# Patient Record
Sex: Male | Born: 1985 | Race: White | Hispanic: No | Marital: Single | Smoking: Former smoker
Health system: Southern US, Community
[De-identification: ages and names within clinical notes are randomized; demographics above are authoritative.]

## PROBLEM LIST (undated history)

## (undated) HISTORY — PX: WISDOM TOOTH EXTRACTION: SHX21

---

## 2013-03-21 ENCOUNTER — Ambulatory Visit: Payer: Self-pay | Admitting: Family Medicine

## 2015-10-27 ENCOUNTER — Encounter: Payer: Self-pay | Admitting: *Deleted

## 2015-10-27 ENCOUNTER — Ambulatory Visit
Admission: EM | Admit: 2015-10-27 | Discharge: 2015-10-27 | Disposition: A | Discharge status: Home / Self Care | Payer: BLUE CROSS/BLUE SHIELD | Attending: Family Medicine | Admitting: Family Medicine

## 2015-10-27 DIAGNOSIS — Z8679 Personal history of other diseases of the circulatory system: Secondary | ICD-10-CM | POA: Diagnosis not present

## 2015-10-27 DIAGNOSIS — Z87891 Personal history of nicotine dependence: Secondary | ICD-10-CM | POA: Diagnosis not present

## 2015-10-27 DIAGNOSIS — R9431 Abnormal electrocardiogram [ECG] [EKG]: Secondary | ICD-10-CM | POA: Diagnosis not present

## 2015-10-27 DIAGNOSIS — R000 Tachycardia, unspecified: Secondary | ICD-10-CM | POA: Diagnosis present

## 2015-10-27 LAB — BASIC METABOLIC PANEL
Anion gap: 11 (ref 5–15)
BUN: 13 mg/dL (ref 6–20)
CHLORIDE: 99 mmol/L — AB (ref 101–111)
CO2: 29 mmol/L (ref 22–32)
CREATININE: 0.73 mg/dL (ref 0.61–1.24)
Calcium: 10.2 mg/dL (ref 8.9–10.3)
GFR calc non Af Amer: >60 mL/min (ref >60)
Glucose, Bld: 94 mg/dL (ref 65–99)
Potassium: 3.9 mmol/L (ref 3.5–5.1)
SODIUM: 139 mmol/L (ref 135–145)

## 2015-10-27 LAB — CBC WITH DIFFERENTIAL/PLATELET
BASOS PCT: 1 %
Basophils Absolute: 0.1 10*3/uL (ref 0–0.1)
EOS ABS: 0 10*3/uL (ref 0–0.7)
Eosinophils Relative: 0 %
HCT: 44.8 % (ref 40.0–52.0)
HEMOGLOBIN: 14.7 g/dL (ref 13.0–18.0)
Lymphocytes Relative: 7 %
Lymphs Abs: 1 10*3/uL (ref 1.0–3.6)
MCH: 29 pg (ref 26.0–34.0)
MCHC: 32.8 g/dL (ref 32.0–36.0)
MCV: 88.4 fL (ref 80.0–100.0)
MONOS PCT: 4 %
Monocytes Absolute: 0.6 10*3/uL (ref 0.2–1.0)
NEUTROS PCT: 88 %
Neutro Abs: 11.9 10*3/uL — ABNORMAL HIGH (ref 1.4–6.5)
Platelets: 295 10*3/uL (ref 150–440)
RBC: 5.06 MIL/uL (ref 4.40–5.90)
RDW: 12.9 % (ref 11.5–14.5)
WBC: 13.6 10*3/uL — AB (ref 3.8–10.6)

## 2015-10-27 NOTE — ED Notes (Signed)
Patient reports symptom of fast heart rate starting today at 11:00. Patient also reports that he has had cold symptoms in the last few weeks that have resolved and most recently symptoms of stomach cramps with loose - soft stools. Patient appears to be anxious.

## 2015-11-20 ENCOUNTER — Other Ambulatory Visit: Payer: Self-pay | Admitting: Family Medicine

## 2015-11-20 DIAGNOSIS — R1011 Right upper quadrant pain: Secondary | ICD-10-CM

## 2015-12-03 ENCOUNTER — Ambulatory Visit
Admission: RE | Admit: 2015-12-03 | Discharge: 2015-12-03 | Disposition: A | Discharge status: Home / Self Care | Payer: BLUE CROSS/BLUE SHIELD | Source: Ambulatory Visit | Attending: Family Medicine | Admitting: Family Medicine

## 2015-12-03 DIAGNOSIS — R1011 Right upper quadrant pain: Secondary | ICD-10-CM | POA: Diagnosis not present

## 2015-12-03 MED ORDER — TECHNETIUM TC 99M MEBROFENIN IV KIT
5.5800 | PACK | Freq: Once | INTRAVENOUS | Status: AC | PRN
  Administered 2015-12-03: 5.58 via INTRAVENOUS

## 2015-12-03 MED ORDER — SINCALIDE 5 MCG IJ SOLR
0.0200 ug/kg | Freq: Once | INTRAMUSCULAR | Status: AC
  Administered 2015-12-03: 1.23 ug via INTRAVENOUS

## 2015-12-19 NOTE — ED Provider Notes (Signed)
CSN: [id number]     Arrival date & time 10/27/15  1435 History   First MD Initiated Contact with Patient 10/27/15 1636     Chief Complaint  Patient presents with  . Tachycardia   (Consider location/radiation/quality/duration/timing/severity/associated sxs/prior Treatment) HPI Comments: 30 yo male with a c/o "fast heart rate" today around 11am, but now resolved. States has had cold symptoms recently and some mild loose stools and stomach cramps. Denies any chest pain, shortness of breath, melena, hematochezia, vomiting, fevers, chills.   The history is provided by the patient.    History reviewed. No pertinent past medical history. Past Surgical History  Procedure Laterality Date  . Wisdom tooth extraction     History reviewed. No pertinent family history. Social History  Substance Use Topics  . Smoking status: Former Games developermoker  . Smokeless tobacco: Never Used  . Alcohol Use: Yes     Comment: occassional drinker    Review of Systems  Allergies  Review of patient's allergies indicates no known allergies.  Home Medications   Prior to Admission medications   Not on File   Meds Ordered and Administered this Visit  Medications - No data to display  BP 132/58 mmHg  Pulse 96  Temp(Src) 98.2 F (36.8 C) (Oral)  Resp 20  Ht 6\' 1"  (1.854 m)  Wt 135 lb (61.236 kg)  BMI 17.82 kg/m2  SpO2 100% No data found.   Physical Exam  Constitutional: He appears well-developed and well-nourished. No distress.  HENT:  Head: Normocephalic and atraumatic.  Right Ear: Tympanic membrane, external ear and ear canal normal.  Left Ear: Tympanic membrane, external ear and ear canal normal.  Nose: Nose normal.  Mouth/Throat: Uvula is midline, oropharynx is clear and moist and mucous membranes are normal. No oropharyngeal exudate or tonsillar abscesses.  Eyes: Conjunctivae and EOM are normal. Pupils are equal, round, and reactive to light. Right eye exhibits no discharge. Left eye exhibits no  discharge. No scleral icterus.  Neck: Normal range of motion. Neck supple. No tracheal deviation present. No thyromegaly present.  Cardiovascular: Normal rate, regular rhythm and normal heart sounds.   Pulmonary/Chest: Effort normal and breath sounds normal. No stridor. No respiratory distress. He has no wheezes. He has no rales. He exhibits no tenderness.  Abdominal: Soft. Bowel sounds are normal. He exhibits no distension and no mass. There is no tenderness. There is no rebound and no guarding.  Lymphadenopathy:    He has no cervical adenopathy.  Neurological: He is alert.  Skin: Skin is warm and dry. No rash noted. He is not diaphoretic.  Nursing note and vitals reviewed.   ED Course  Procedures (including critical care time)  Labs Review Labs Reviewed  CBC WITH DIFFERENTIAL/PLATELET - Abnormal; Notable for the following:    WBC 13.6 (*)    Neutro Abs 11.9 (*)    All other components within normal limits  BASIC METABOLIC PANEL - Abnormal; Notable for the following:    Chloride 99 (*)    All other components within normal limits    Imaging Review No results found.   Visual Acuity Review  Right Eye Distance:   Left Eye Distance:   Bilateral Distance:    Right Eye Near:   Left Eye Near:    Bilateral Near:      EKG: there are no previous tracings available for comparison, normal sinus rhythm, incomplete right bundle branch block; reviewed by me and agree.  MDM   1. H/O sinus tachycardia  2. Abnormal EKG   (curently asymptomatic)   There are no discharge medications for this patient.  1. Lab results and diagnosis reviewed with patient; recommend follow up with PCP for referral to cardiologist and possible further work-up 2. Follow-up prn if symptoms worsen or don't improve    Payton Mccallum, MD 12/19/15 2051

## 2016-01-14 ENCOUNTER — Encounter: Payer: Self-pay | Admitting: Dietician

## 2016-01-14 ENCOUNTER — Encounter: Payer: BLUE CROSS/BLUE SHIELD | Attending: Pediatrics | Admitting: Dietician

## 2016-01-14 VITALS — Ht 74.0 in | Wt 127.3 lb

## 2016-01-14 DIAGNOSIS — R634 Abnormal weight loss: Secondary | ICD-10-CM

## 2016-01-14 NOTE — Progress Notes (Signed)
Medical Nutrition Therapy: Visit start time: 8:30  end time: 9:45am Assessment:  Diagnosis: abnormal weight loss Past medical history: no previous health issues reported Psychosocial issues/ stress concerns: Patient rates his stress as moderate and indicates "ok" as to how well he is dealing with his stress.  Preferred learning method:  . Visual  Current weight: 127.3 lbs  Height: 74 in Medications, supplements: probiotic; no other medications Progress and evaluation:  Patient in for initial medical nutrition therapy appointment. Reports that his typical adult weight ranges from 130-135 lbs. At the end of last year he began to have a change in bowl habits from 2-3 bowel movements per day to averaging 1 per day. He also noticed a change in the consistency of the stool from more solid to at times to varying -"floating" or "mushy"  or loose or sometimes what he describes as a smear. He also reports some abdominal cramping or discomfort and bloating. Celiac and H-pylori have been ruled out. He was initially told to eat a low fat diet in case his gall bladder was involved.He reports this was a significant change in his diet which previously included frequent high fat foods such as pizza and cheeseburgers. He lost weight with lowest reported weight of 122 lbs. A FODMAP diet was recommended after gall bladder disease was ruled out. He has followed this diet and reports he feels better but the inconsistency of bowel movements both in frequency and texture has not changed. He also continues to experience bloating and discomfort especially in the evenings. He has gained approximately 2 lbs in past month. He brought food records which indicate adherence to FODMAP with some exceptions of regular bread and regular milk. His diet is adequate in protein, low in calcium sources, fruits and vegetables (many of these limited due to diet restrictions) and also low in grains. His diet is also low in healthy mono and  polyunsaturated fats. He has increased his water intake but overall fluid intake is possibly low. Physical activity: running 3x/week for 1 hour.  Dietary Intake:  Usual eating pattern includes 3 meals and 0-1 snacks per day. Dining out frequency: 0 meals per week.  Breakfast: 1/2 cup coffee (4oz), banana; sometimes also eats cheerios (dry) Lunch:Ex. 1 packet salmon or tuna, 1 slice of GF bread, 1 GF cookie or cheerios/rice crackers or leftovers such as rice/chicken, Supper: Ex.GF pasta, sauce, salad or GF tacos with beef, salad Snack: Ex.GF cookies, soy ice cream  Beverages: 4-5+ cups of water per day, 4-6 oz. coffee  Nutrition Care Education: Basic nutrition: Discussed ways to better balance meals to increase allowed grains and fruits/vegetables. Encouraged more calcium sources possibly through more soy milk or lactaid milk, hard cheeses, and soy yogurts. Discussed pro and anti-inflammatory foods. Ways to increase calories: Gave and reviewed list of healthy ways to add calories to the diet. Discussed adding more sources of healthy fats. FODMAP: Patient has a good understanding of high FODMAP foods and overall is eliminating most of these.   Nutritional Diagnosis:  [facility]-3.1 Underweight As related to history of being underweight, recent decrease in calories due to GI issues.  As evidenced by diet history and BMI of 16.4..  Intervention:  Continue with eliminating high FODMAP foods. Refer to list of ways to add protein and calories and incorporate into diet. Add a protein at breakfast such as peanut butter. Balance meals with protein and starch along with allowed vegetables and fruits that are on low Fodmap diet. Add oil or trans  free margarine (Promise, I can't believe it's not butter"  to any appropriate food ( vegetables, oatmeal, GF bread, rice)  Education Materials given:  . Handouts related to low and high FODMAP foods . Ways to increase calories and protein. . Goals/  instructions Learner/ who was taught:  . Patient  Level of understanding: Marland Kitchen. Verbalizes/ demonstrates competency  Learning barriers: . None  Willingness to learn/ readiness for change: . Eager, change in progress Monitoring and Evaluation:  Dietary intake, exercise,, and body weight      follow up: 02/12/16 at 1:00pm

## 2016-01-14 NOTE — Patient Instructions (Signed)
Continue with eliminating high FODMAP foods. Refer to list of ways to add protein and calories. Add a protein at breakfast such as peanut butter. Balance meals with protein and starch along with allowed vegetables and fruits that are on low Fodmap diet. Add oil or trans free margarine (Promise, I can't believe it's  to any appropriate food ( vegetables, oatmeal, GF bread, rice)

## 2016-02-12 ENCOUNTER — Encounter: Payer: BLUE CROSS/BLUE SHIELD | Attending: Pediatrics | Admitting: Dietician

## 2016-02-12 DIAGNOSIS — R634 Abnormal weight loss: Secondary | ICD-10-CM | POA: Diagnosis not present

## 2016-02-12 NOTE — Progress Notes (Signed)
Medical Nutrition Therapy Follow-up visit:  Time with patient: 1:15- 2:15 Visit #: 2 ASSESSMENT:  Diagnosis:abnormal weight loss  Current weight:128.3 lbs    Height: 74 in Medications: See list Progress and evaluation: Patient in for medical nutrition therapy follow-up. He has gained 1 lb in past month and now weighs 5 lbs greater than lowest reported weight of 122 lbs. He kept food records which indicate he is following the FODMAP diet which was reviewed with him at initial visit. He has increased his intake of fruits/vegetables allowed on FODMAP diet. He has also made an effort to include more fat in the form of nuts, avocados, margarine and olive oil. He reports he has more energy but there have been no changes in frequency and very little difference in consistency of bowel movements. He reports that he sometimes has to strain to have a bowel movement but usually has 1 per day. He continues to have bloating after every meal and stays bloated for most of the day. His diet continues to be low in calcium sources and is also low in allowed grains on most days.  NUTRITION CARE EDUCATION: Basic Nutrition: Reviewed food records with patient.Discussed recommendations for calcium and ways to meet through diet avoiding dairy products as well as need to add more grains and options. Ways to increase calories: Commended on the foods added to his diet to increase calories and encouraged him to continue to do so especially in the form of soy milk products fortified with calcium and healthy mono and polyunsaturated fats.  INTERVENTION:  Continue to add healthy fats to any food possible. Continue with FODMAP diet. Include at least 3 non-dairy milk products per day that are fortified with calcium. Make appointment with primary care physician to discuss if GI consult is needed. Try Quinoa.  EDUCATION MATERIALS GIVEN:  . Goals/ instructions  LEARNER/ who was taught:  . Patient   LEVEL OF  UNDERSTANDING: . Verbalizes/ demonstrates competency LEARNING BARRIERS: . None  WILLINGNESS TO LEARN/READINESS FOR CHANGE: . Eager, change in progress  MONITORING AND EVALUATION:  May 23,2017 at 1:30pm

## 2016-02-12 NOTE — Patient Instructions (Signed)
Continue to add healthy fats to any food possible. Continue with FODMAP diet. Include at least 3 non-dairy milk products per day that are fortified with calcium. Make appointment with primary care physician to discuss if GI consult is needed. Try Quinoa.

## 2016-03-09 ENCOUNTER — Encounter: Payer: BLUE CROSS/BLUE SHIELD | Attending: Pediatrics | Admitting: Dietician

## 2016-03-09 VITALS — Ht 74.0 in | Wt 129.8 lb

## 2016-03-09 DIAGNOSIS — R634 Abnormal weight loss: Secondary | ICD-10-CM

## 2016-03-09 NOTE — Progress Notes (Signed)
Medical Nutrition Therapy Follow-up visit:  Time with patient: 1330- 1355 Visit #:3 ASSESSMENT:  Diagnosis:abnormal weight loss  Current weight:129.8 lbs    Height:74 in Medications: none  Progress and evaluation: Patient reports he had a colonoscopy and endoscopy yesterday, 03/08/16, with normal results. He also had a Ct abdominal scan with normal results.He has gained 1.5 lbs in past month and a total of 2.5 lbs in past 2 months. His weight goal range is 130 -135 lbs which he states is typical adult weight. He continues to have bloating after most meals despite following a FODMAP diet.  He is now drinking Lactaid milk to increase his calcium intake and has tried to increase allowed grains. He typically eats 2-3 servings of allowed fruits and 2 servings of vegetables. His diet continues to be adequate in protein. He reports he feels better when following this meal pattern and food choices and states when he was out of town for a weekend and didn't adhere to diet suggestions, he had more abdominal cramping and less energy. Peter Bradley denies any history of purging or another time of weight loss other than his recent loss and when he has been sick.  Physical activity:Reports he is trying to exercise more; runs 3 times per week for 1 hour  NUTRITION CARE EDUCATION: Basic nutrition:  Commended Peter Bradley on making an effort to include calcium sources, and more fruits/vegetables and grains. Encouraged to continue to include a consistent pattern of meals and snacks to help reach and maintain weight goal. Encouraged again the addition of healthy fats to help increase calories. Discussed how his normal adult BMI of 16.7  indicates underweight, so maintaining weight at 135 lbs or higher is important and the need for concern if he loses weight again.  INTERVENTION:  Continue with previous goals set. May want to talk with PCP regarding antispasmodic medication if bloating and cramping continue.  LEARNER/ who  was taught:  . Patient  LEVEL OF UNDERSTANDING: . Verbalizes/ demonstrates competency LEARNING BARRIERS: . None WILLINGNESS TO LEARN/READINESS FOR CHANGE: . Eager, change in progress  MONITORING AND EVALUATION: no follow-up scheduled. Patient was encouraged to call if desires further help with his diet/nutrition.

## 2018-05-31 ENCOUNTER — Ambulatory Visit
Admission: EM | Admit: 2018-05-31 | Discharge: 2018-05-31 | Disposition: A | Discharge status: Home / Self Care | Payer: BLUE CROSS/BLUE SHIELD | Attending: Family Medicine | Admitting: Family Medicine

## 2018-05-31 ENCOUNTER — Other Ambulatory Visit: Payer: Self-pay

## 2018-05-31 ENCOUNTER — Encounter: Payer: Self-pay | Admitting: Emergency Medicine

## 2018-05-31 ENCOUNTER — Ambulatory Visit (INDEPENDENT_AMBULATORY_CARE_PROVIDER_SITE_OTHER): Payer: Self-pay

## 2018-05-31 DIAGNOSIS — S161XXA Strain of muscle, fascia and tendon at neck level, initial encounter: Secondary | ICD-10-CM

## 2018-05-31 DIAGNOSIS — T148XXA Other injury of unspecified body region, initial encounter: Secondary | ICD-10-CM

## 2018-05-31 MED ORDER — CYCLOBENZAPRINE HCL 10 MG PO TABS: 10.0000 mg | ORAL_TABLET | Freq: Two times a day (BID) | ORAL | 0 refills | Status: DC | PRN
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
## 2018-05-31 NOTE — Discharge Instructions (Addendum)
Take medication as prescribed.  Take over-the-counter ibuprofen as needed.  Rest. Drink plenty of fluids.   Follow up with your primary care physician this week as needed. Return to Urgent care for new or worsening concerns.
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
## 2020-05-07 ENCOUNTER — Other Ambulatory Visit: Payer: Self-pay | Admitting: Family Medicine

## 2020-05-07 DIAGNOSIS — N5082 Scrotal pain: Secondary | ICD-10-CM

## 2020-05-12 ENCOUNTER — Other Ambulatory Visit: Payer: Self-pay

## 2020-05-12 ENCOUNTER — Ambulatory Visit
Admission: RE | Admit: 2020-05-12 | Discharge: 2020-05-12 | Disposition: A | Discharge status: Home / Self Care | Payer: BC Managed Care – PPO | Source: Ambulatory Visit | Attending: Family Medicine | Admitting: Family Medicine

## 2020-05-12 DIAGNOSIS — N5082 Scrotal pain: Secondary | ICD-10-CM | POA: Diagnosis not present

## 2020-11-21 IMAGING — US US SCROTUM W/ DOPPLER COMPLETE
1 series · 14 of 25 positions shown · non-contrast
Comparison: None.

CLINICAL DATA: Intermittent scrotal and groin pain for 1 month

EXAM:
SCROTAL ULTRASOUND
DOPPLER ULTRASOUND OF THE TESTICLES
TECHNIQUE: Complete ultrasound examination of the testicles, epididymis, and
other scrotal structures was performed. Color and spectral Doppler
ultrasound were also utilized to evaluate blood flow to the
testicles.

[Series 1: us scrotum w/ doppler complete · 0.07mm/px · 14 of 64 slices shown]
[im 1/64]
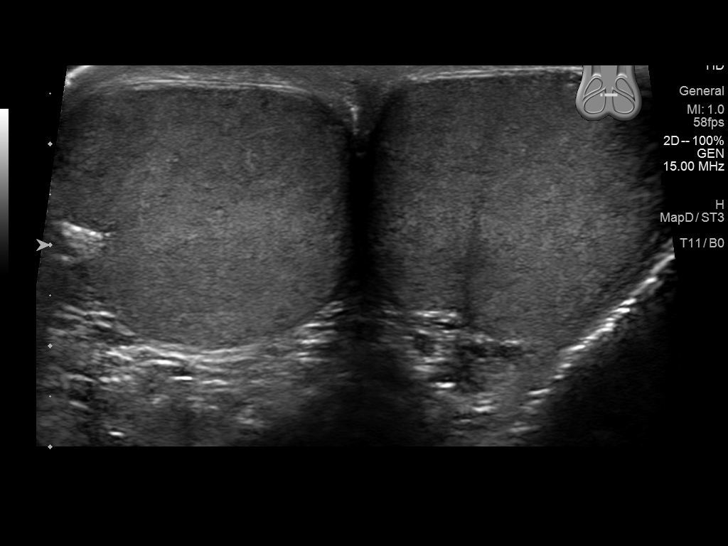
[im 6/64]
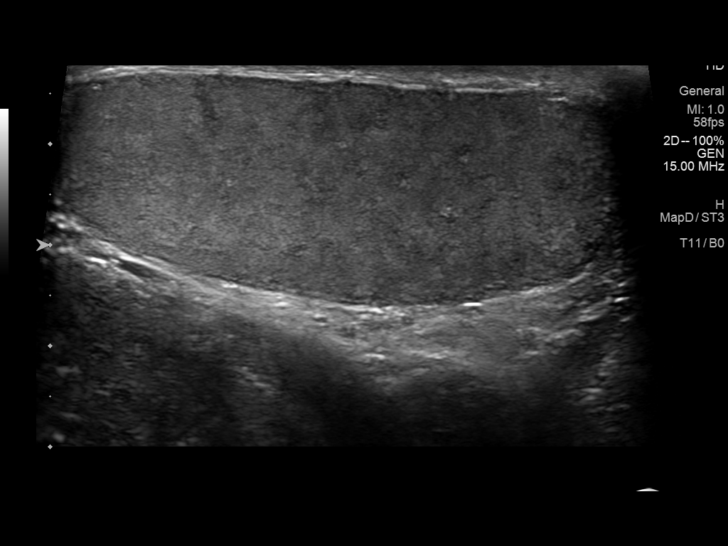
[im 11/64]
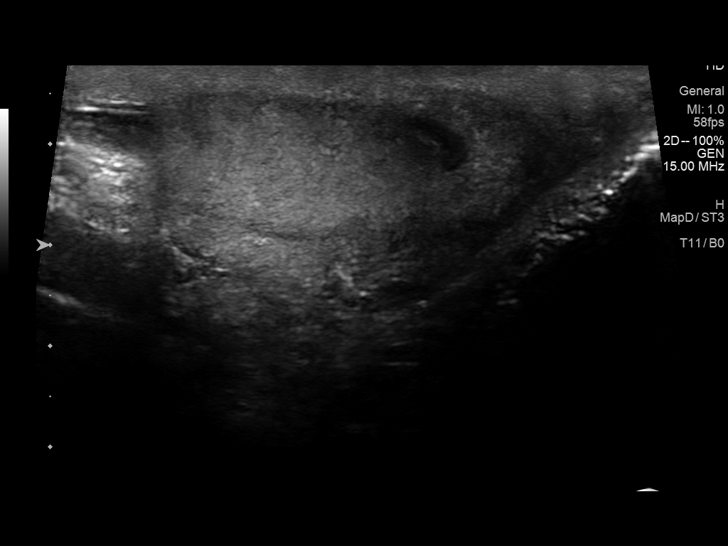
[im 16/64]
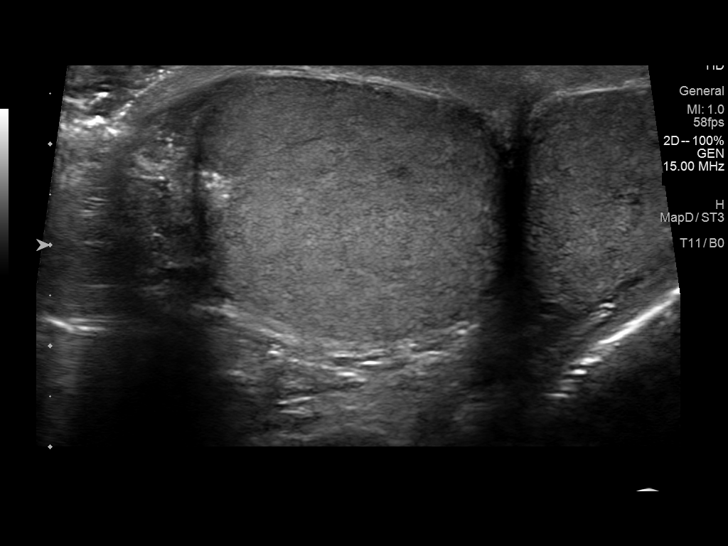
[im 22/64]
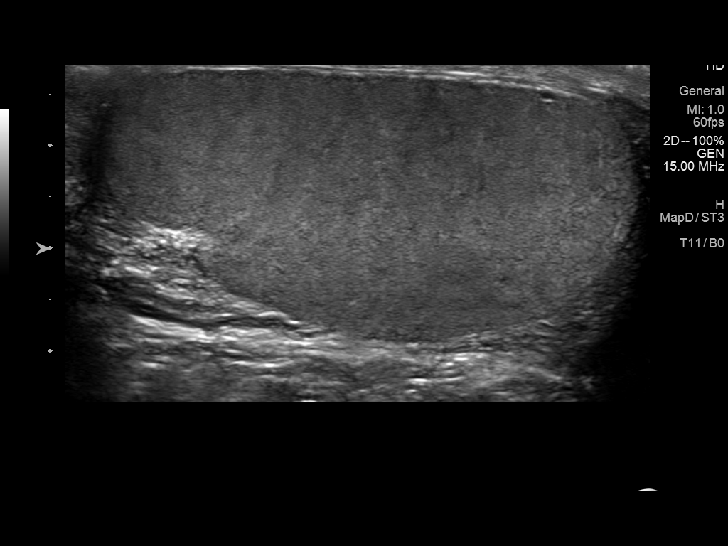
[im 24/64]
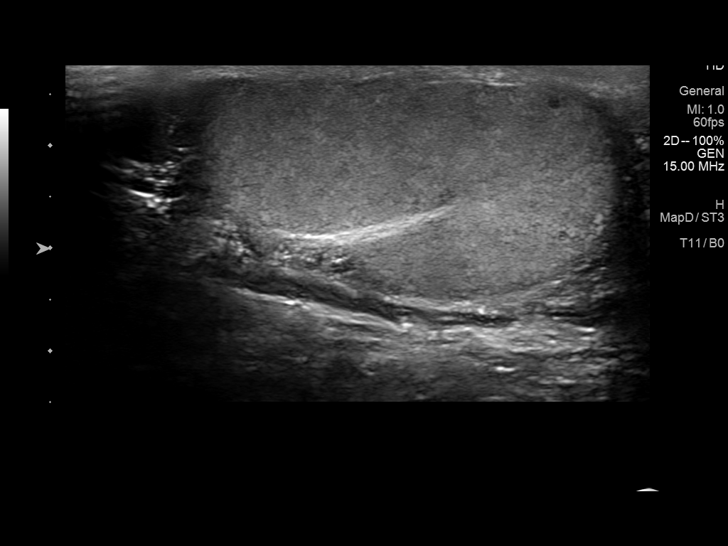
[im 29/64]
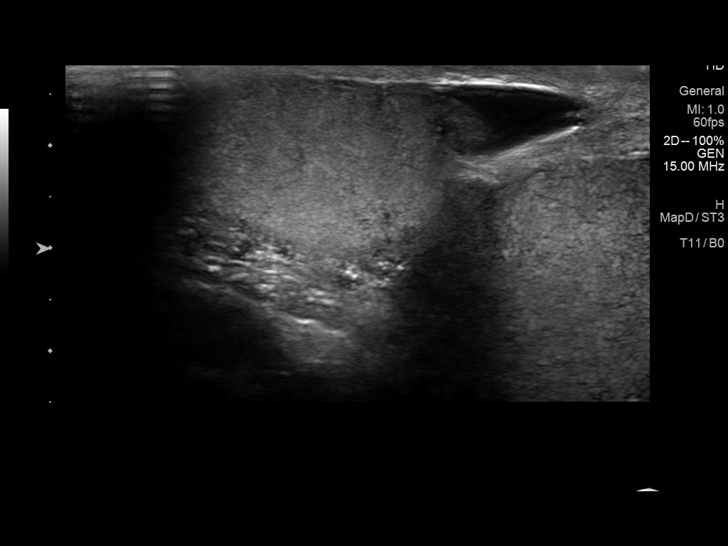
[im 35/64]
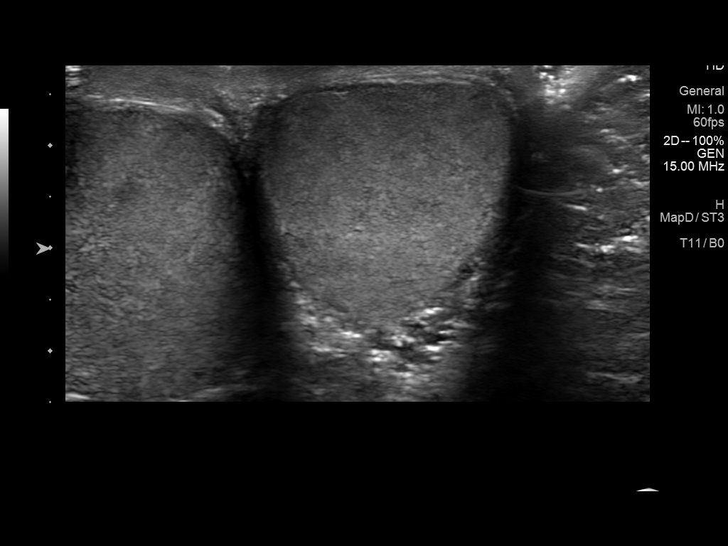
[im 40/64]
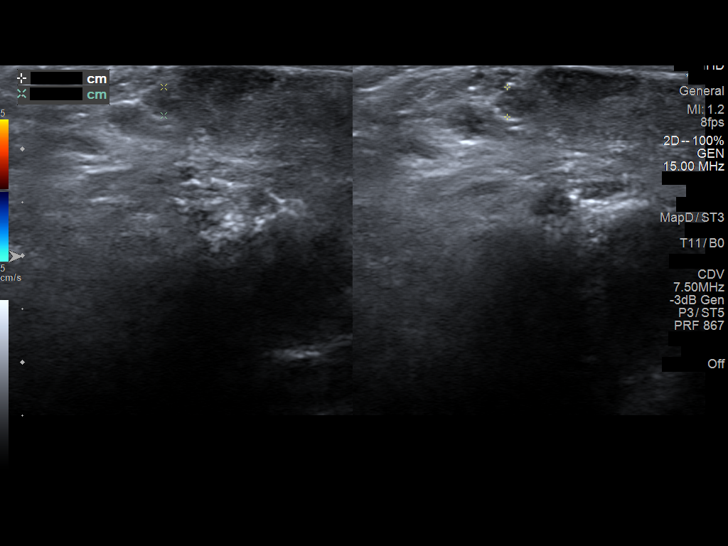
[im 43/64]
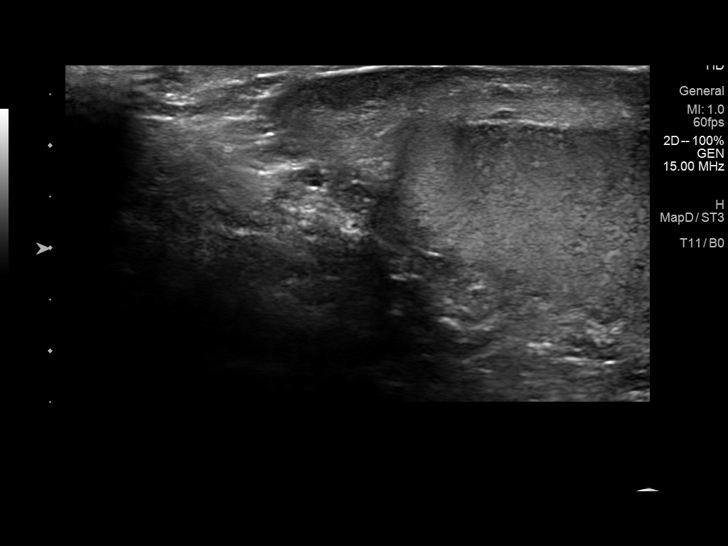
[im 48/64]
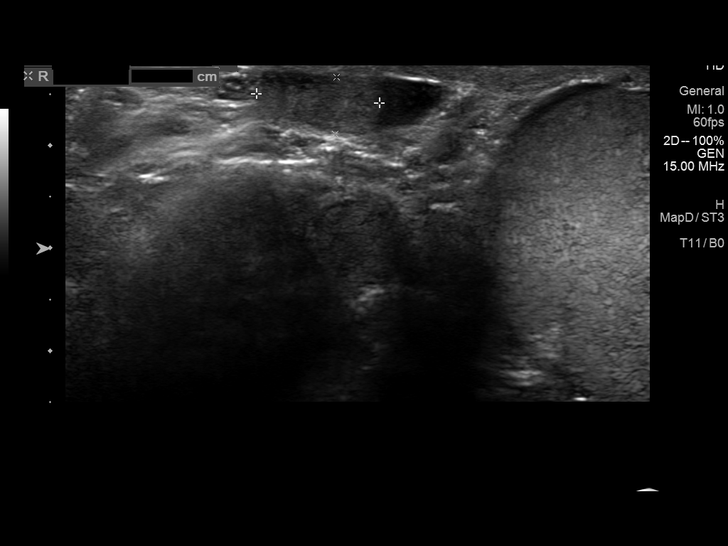
[im 53/64]
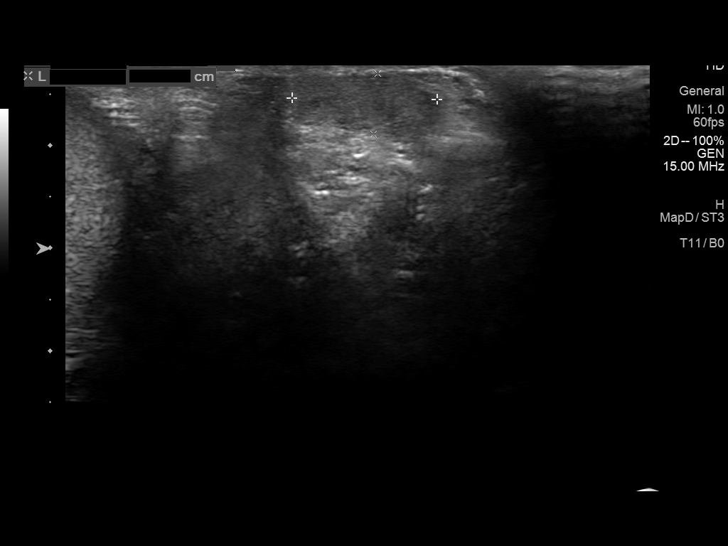
[im 58/64]
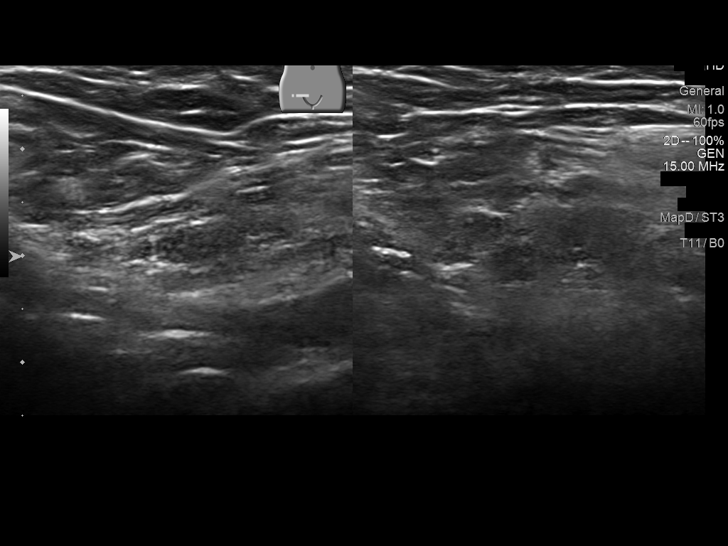
[im 64/64]
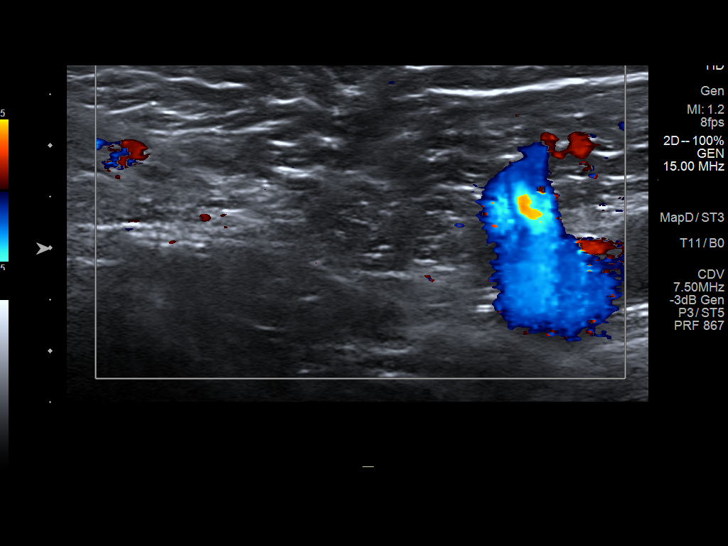

[14 of 25 positions shown; findings below may reference images not displayed]

FINDINGS: Right testicle

Measurements: 5.4 x 2.1 x 3.1 cm. No mass or microlithiasis
visualized.

Left testicle

Measurements: 5.4 x 2.6 x 3.2 cm. No mass or microlithiasis
visualized.

Right epididymis:  Normal in size and appearance.

Left epididymis:  Normal in size and appearance.

Hydrocele:  None visualized.

Varicocele:  None visualized.

Pulsed Doppler interrogation of both testes demonstrates normal low
resistance arterial and venous waveforms bilaterally.

No inguinal abnormality appreciated by ultrasound on either side.
IMPRESSION: Normal scrotal ultrasound and Doppler.

## 2023-12-23 ENCOUNTER — Ambulatory Visit
Admission: EM | Admit: 2023-12-23 | Discharge: 2023-12-23 | Disposition: A | Discharge status: Home / Self Care | Attending: Emergency Medicine | Admitting: Emergency Medicine

## 2023-12-23 DIAGNOSIS — M62838 Other muscle spasm: Secondary | ICD-10-CM

## 2023-12-23 DIAGNOSIS — R202 Paresthesia of skin: Secondary | ICD-10-CM

## 2023-12-23 MED ORDER — PREDNISONE 10 MG (21) PO TBPK: ORAL_TABLET | ORAL | 0 refills | Status: AC

## 2023-12-23 MED ORDER — BACLOFEN 10 MG PO TABS: 10.0000 mg | ORAL_TABLET | Freq: Three times a day (TID) | ORAL | 0 refills | Status: AC

## 2023-12-23 NOTE — ED Triage Notes (Signed)
 Patient states that went he went to bed last night his right shoulder blade starter hurting and hurts when he breathes in. Patient states that left pinky finger is also going numb.

## 2023-12-23 NOTE — Discharge Instructions (Addendum)
 As we discussed, I suspect that your numbness in your right for finger is a result of the muscle tension in your right trapezius, a large muscle in your right shoulder.  Starting tomorrow morning at breakfast, and continue each day at breakfast of the neck 6 days, I would you to take the prednisone according to the package instructions.  You may start taking the baclofen, and nonsedating muscle laxer, tonight to help alleviate muscle tension.  You may take it every 8 hours.  Follow the shoulder range of motion exercises to help loosen up the muscle in your right shoulder and see if this improves your symptoms.  You may apply moist heat to your right shoulder for 20 minutes at a time, 2-3 times a day, to help with muscle tension and pain relief.  If your symptoms do not resolve, or improve, I recommend that you follow-up with the spine clinic.  Their phone number is [phone].  Their address is [address], [address], [place], [place].  [address].

## 2023-12-23 NOTE — ED Provider Notes (Signed)
 [facility]    CSN: [id number] Arrival date & time: 12/23/23  1539      History   Chief Complaint Chief Complaint  Patient presents with   Shoulder Pain    HPI Peter Bradley is a 38 y.o. male.   HPI  38 year old male with no significant past medical history presents for evaluation of pain in his right shoulder and numbness in his right fifth finger that started last night.  He reports that he was laying in bed with his arm up over his head and when he brought his arm down the pain developed and so to the numbness.  Its become progressively worse since that time.  He denies any injury or heavy lifting.  History reviewed. No pertinent past medical history.  There are no active problems to display for this patient.   Past Surgical History:  Procedure Laterality Date   WISDOM TOOTH EXTRACTION         Home Medications    Prior to Admission medications   Medication Sig Start Date End Date Taking? Authorizing Provider  baclofen (LIORESAL) 10 MG tablet Take 1 tablet (10 mg total) by mouth 3 (three) times daily. 12/23/23  Yes Becky Augusta, NP  predniSONE (STERAPRED UNI-PAK 21 TAB) 10 MG (21) TBPK tablet Take 6 tablets on day 1, 5 tablets day 2, 4 tablets day 3, 3 tablets day 4, 2 tablets day 5, 1 tablet day 6 12/23/23  Yes Becky Augusta, NP    Family History Family History  Problem Relation Age of Onset   Colon cancer Mother    Healthy Father     Social History Social History   Tobacco Use   Smoking status: Former    Current packs/day: 0.00    Types: Cigarettes    Quit date: 05/31/2006    Years since quitting: 17.5   Smokeless tobacco: Never  Vaping Use   Vaping status: Never Used  Substance Use Topics   Alcohol use: Yes    Comment: occassional drinker   Drug use: No     Allergies   Patient has no known allergies.   Review of Systems Review of Systems  Musculoskeletal:  Positive for myalgias.  Neurological:  Positive for numbness. Negative  for weakness.     Physical Exam Triage Vital Signs ED Triage Vitals  Encounter Vitals Group     BP 12/23/23 1553 115/71     Systolic BP Percentile --      Diastolic BP Percentile --      Pulse Rate 12/23/23 1553 (!) 59     Resp 12/23/23 1553 17     Temp 12/23/23 1553 98.1 F (36.7 C)     Temp Source 12/23/23 1553 Oral     SpO2 12/23/23 1553 97 %     Weight --      Height --      Head Circumference --      Peak Flow --      Pain Score 12/23/23 1552 2     Pain Loc --      Pain Education --      Exclude from Growth Chart --    No data found.  Updated Vital Signs BP 115/71 (BP Location: Right Arm)   Pulse (!) 59   Temp 98.1 F (36.7 C) (Oral)   Resp 17   SpO2 97%   Visual Acuity Right Eye Distance:   Left Eye Distance:   Bilateral Distance:    Right Eye Near:  Left Eye Near:    Bilateral Near:     Physical Exam Vitals and nursing note reviewed.  Constitutional:      Appearance: Normal appearance.  Musculoskeletal:        General: Tenderness present. No signs of injury.  Skin:    General: Skin is warm and dry.     Capillary Refill: Capillary refill takes less than 2 seconds.  Neurological:     General: No focal deficit present.     Mental Status: He is alert and oriented to person, place, and time.      UC Treatments / Results  Labs (all labs ordered are listed, but only abnormal results are displayed) Labs Reviewed - No data to display  EKG   Radiology No results found.  Procedures Procedures (including critical care time)  Medications Ordered in UC Medications - No data to display  Initial Impression / Assessment and Plan / UC Course  I have reviewed the triage vital signs and the nursing notes.  Pertinent labs & imaging results that were available during my care of the patient were reviewed by me and considered in my medical decision making (see chart for details).   Patient is a pleasant, nontoxic-appearing 38 year old male presenting  for evaluation of right shoulder pain and numbness in his right fifth finger as outlined HPI above.  In the exam room he is moving his upper extremities without difficulty.  His bilateral grip strength are 5/5.  He denies any burning or shooting pain in his right arm just numbness in his right fifth finger.  He has no bony tenderness or step-off in his thoracic spine.  He does have significant muscle tension in his upper trapezius near his thoracic spine as well as tightness to his right scapula.  I suspect that his muscle tension is putting pressure on one of his sensory nerves therefore causing the numbness.  I will treat him conservatively with a short 6-day prednisone taper and nonsedating muscle relaxer baclofen.  Also shoulder exercises to help loosen up the muscle and see if his symptoms improved.  If his symptoms do not improve I suggest that he follow-up with spine.  I have given him the contact information.   Final Clinical Impressions(s) / UC Diagnoses   Final diagnoses:  Muscle spasm of right shoulder  Paresthesia of finger     Discharge Instructions      As we discussed, I suspect that your numbness in your right for finger is a result of the muscle tension in your right trapezius, a large muscle in your right shoulder.  Starting tomorrow morning at breakfast, and continue each day at breakfast of the neck 6 days, I would you to take the prednisone according to the package instructions.  You may start taking the baclofen, and nonsedating muscle laxer, tonight to help alleviate muscle tension.  You may take it every 8 hours.  Follow the shoulder range of motion exercises to help loosen up the muscle in your right shoulder and see if this improves your symptoms.  You may apply moist heat to your right shoulder for 20 minutes at a time, 2-3 times a day, to help with muscle tension and pain relief.  If your symptoms do not resolve, or improve, I recommend that you follow-up with the  spine clinic.  Their phone number is [phone].  Their address is [address], [address], [place], [place].  [address].     ED Prescriptions  Medication Sig Dispense Auth. Provider   predniSONE (STERAPRED UNI-PAK 21 TAB) 10 MG (21) TBPK tablet Take 6 tablets on day 1, 5 tablets day 2, 4 tablets day 3, 3 tablets day 4, 2 tablets day 5, 1 tablet day 6 21 tablet Becky Augusta, NP   baclofen (LIORESAL) 10 MG tablet Take 1 tablet (10 mg total) by mouth 3 (three) times daily. 30 each Becky Augusta, NP      PDMP not reviewed this encounter.   Becky Augusta, NP 12/23/23 1606
